# Patient Record
Sex: Male | Born: 1980
Health system: Southern US, Community
[De-identification: ages and names within clinical notes are randomized; demographics above are authoritative.]

## PROBLEM LIST (undated history)

## (undated) DIAGNOSIS — J45909 Unspecified asthma, uncomplicated: Secondary | ICD-10-CM

## (undated) HISTORY — DX: Unspecified asthma, uncomplicated: J45.909

---

## 1999-11-27 ENCOUNTER — Emergency Department (HOSPITAL_COMMUNITY): Admission: EM | Admit: 1999-11-27 | Discharge: 1999-11-27 | Payer: Self-pay | Admitting: Emergency Medicine

## 2000-05-31 ENCOUNTER — Emergency Department (HOSPITAL_COMMUNITY): Admission: EM | Admit: 2000-05-31 | Discharge: 2000-05-31 | Payer: Self-pay | Admitting: Emergency Medicine

## 2000-05-31 ENCOUNTER — Encounter: Payer: Self-pay | Admitting: Emergency Medicine

## 2000-12-15 ENCOUNTER — Emergency Department (HOSPITAL_COMMUNITY): Admission: EM | Admit: 2000-12-15 | Discharge: 2000-12-15 | Payer: Self-pay | Admitting: Emergency Medicine

## 2013-09-14 ENCOUNTER — Emergency Department: Payer: Self-pay | Admitting: Emergency Medicine

## 2014-05-21 IMAGING — CT CT HEAD WITHOUT CONTRAST
1 series · 16 of 30 positions shown, 20 images · non-contrast
Comparison: None.

CLINICAL DATA: Severe headaches

EXAM:
CT HEAD WITHOUT CONTRAST
TECHNIQUE: Contiguous axial images were obtained from the base of the skull
through the vertex without intravenous contrast.

[Series 2: head wo · axial · 0.42mm/px · z∈[+635,+761]mm · 16 of 32 slices shown, 20 images]
[im 2/32  brain]
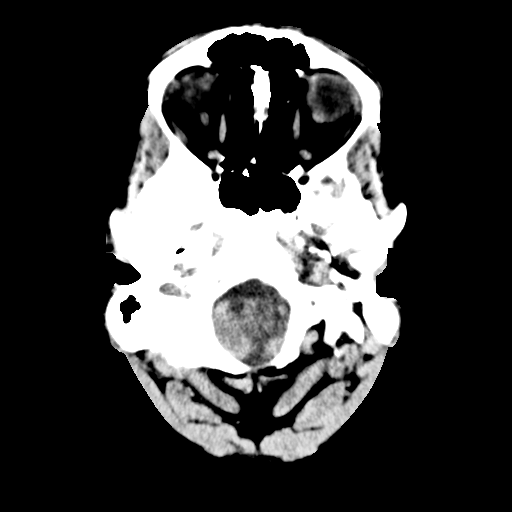
[im 2/32  bone]
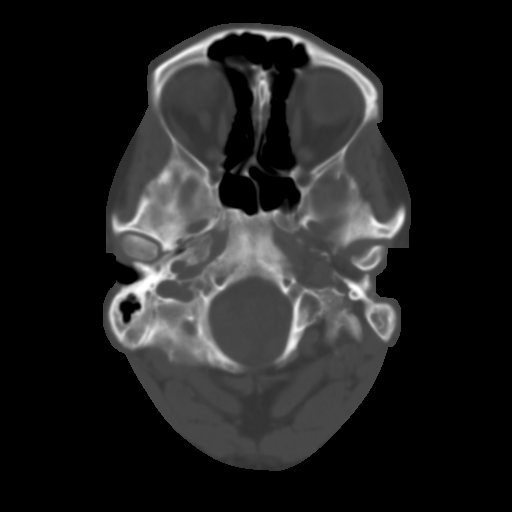
[im 4/32  brain]
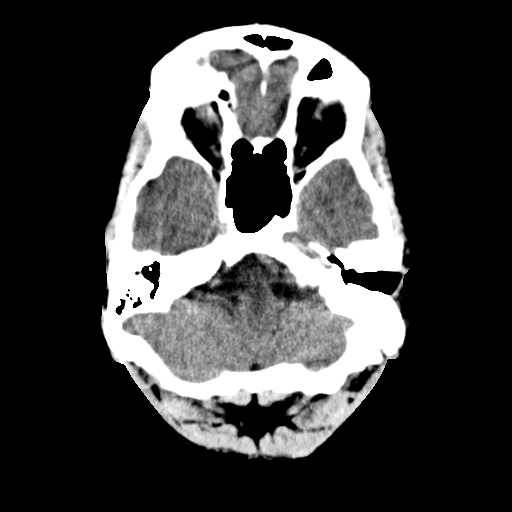
[im 6/32  brain]
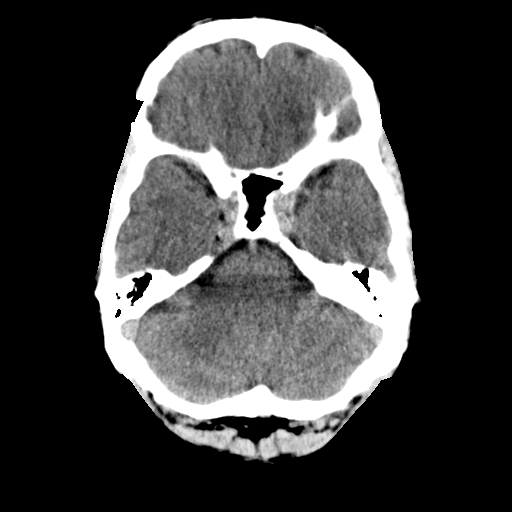
[im 8/32  brain]
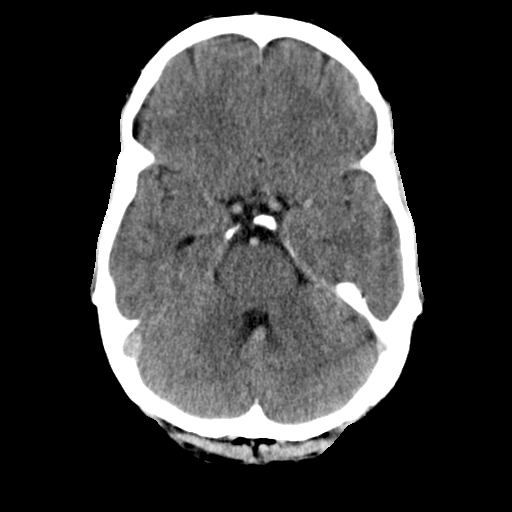
[im 9/32  brain]
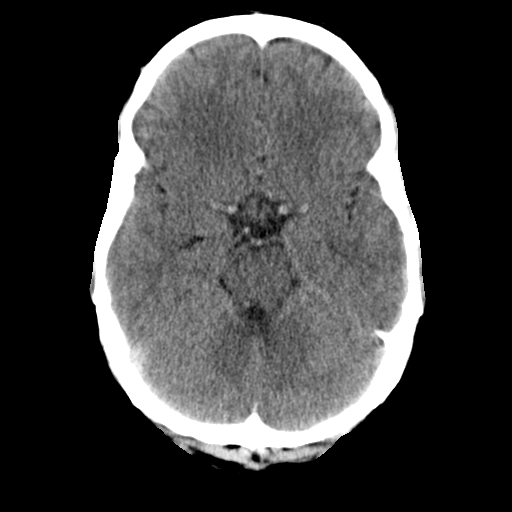
[im 9/32  bone]
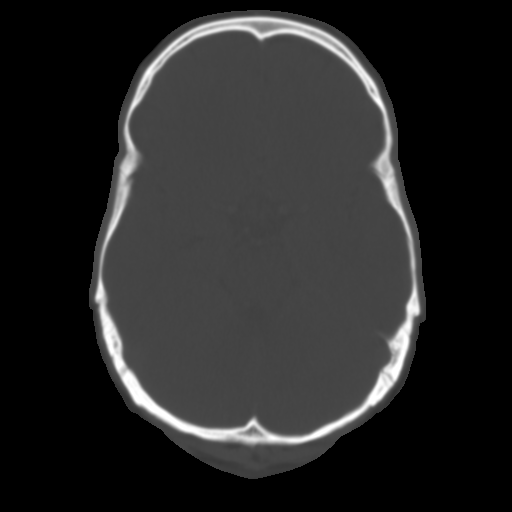
[im 11/32  brain]
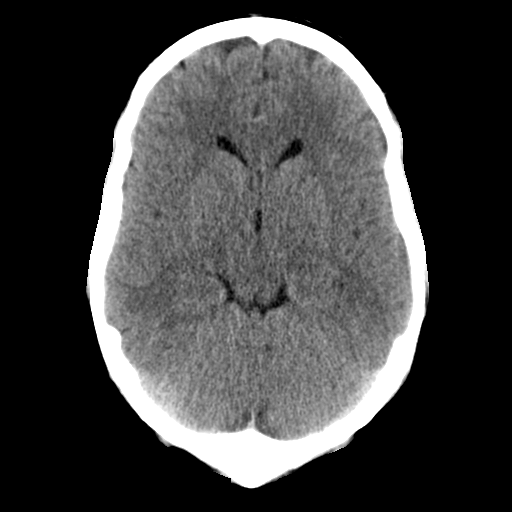
[im 13/32  brain]
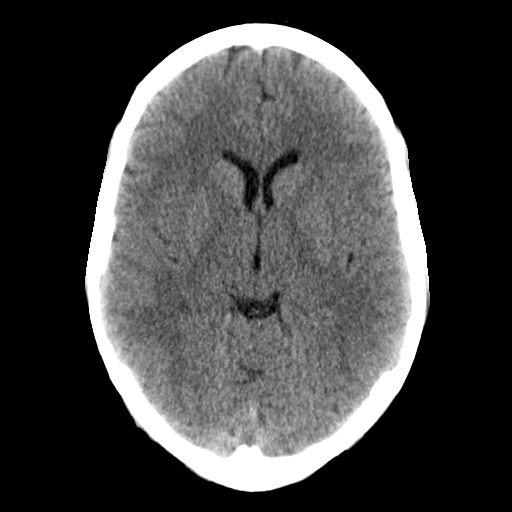
[im 15/32  brain]
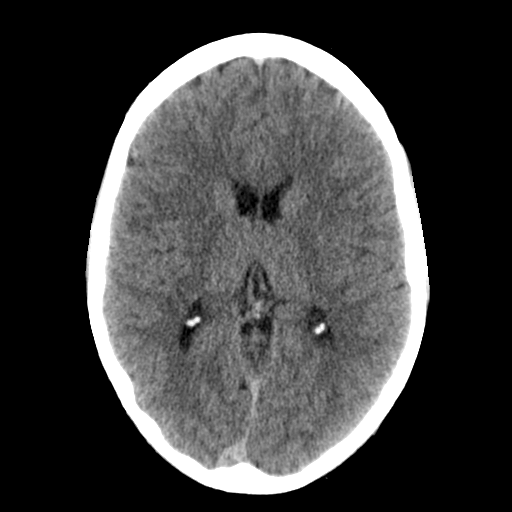
[im 17/32  brain]
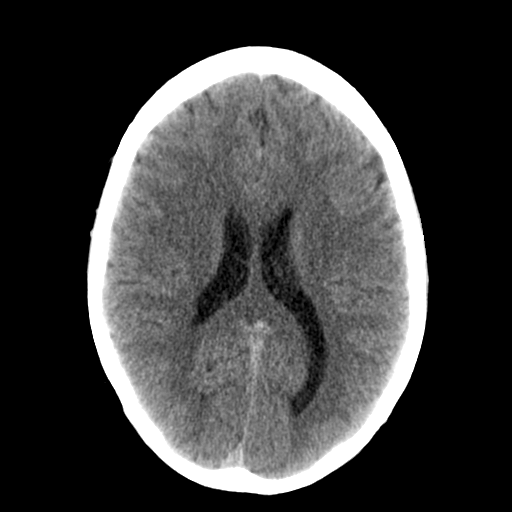
[im 17/32  bone]
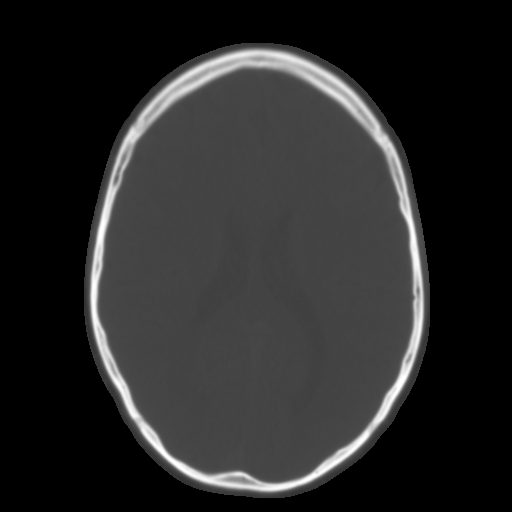
[im 19/32  brain]
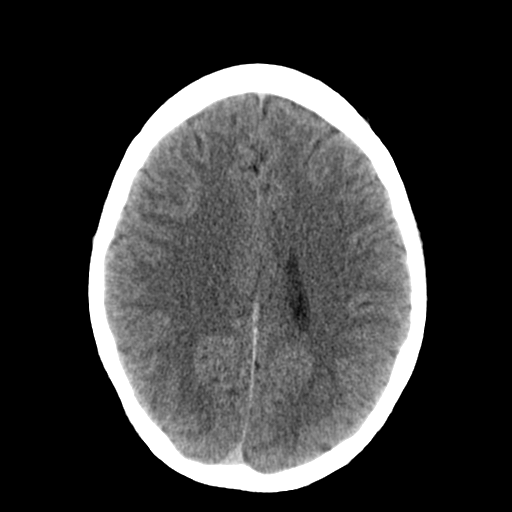
[im 21/32  brain]
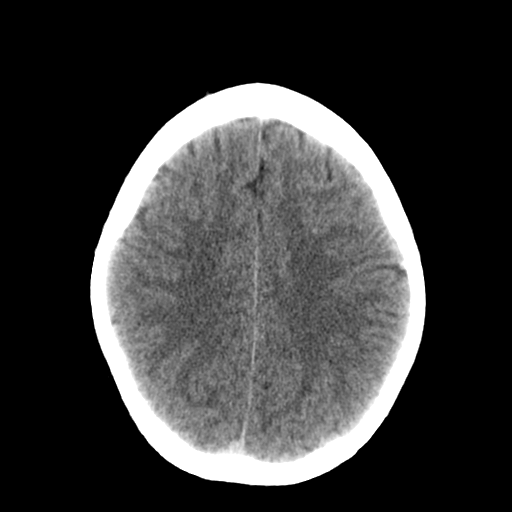
[im 23/32  brain]
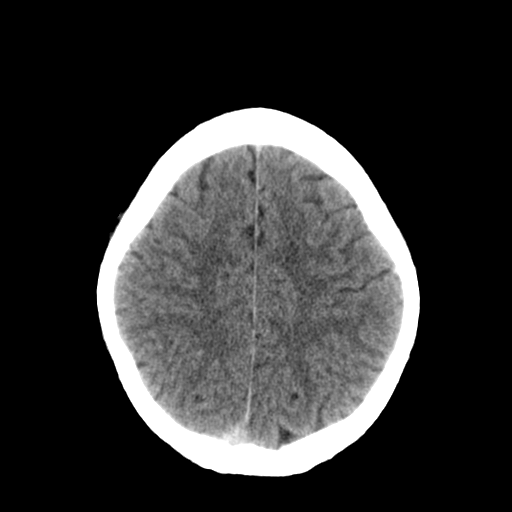
[im 24/32  brain]
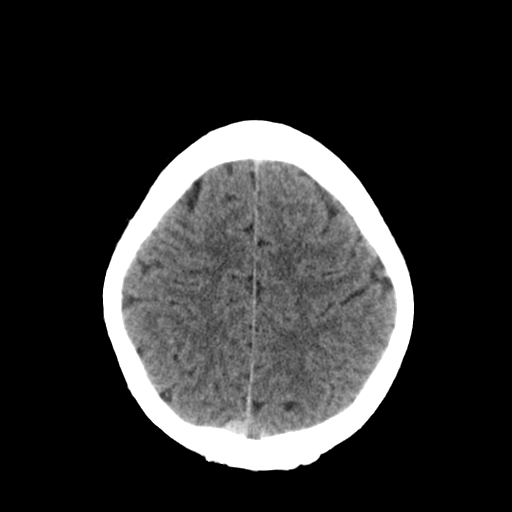
[im 24/32  bone]
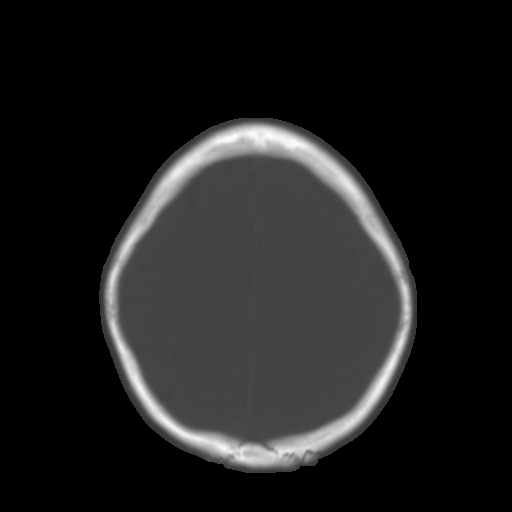
[im 26/32  brain]
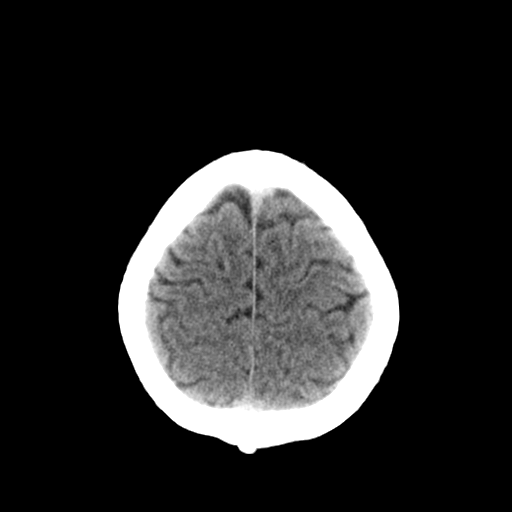
[im 28/32  brain]
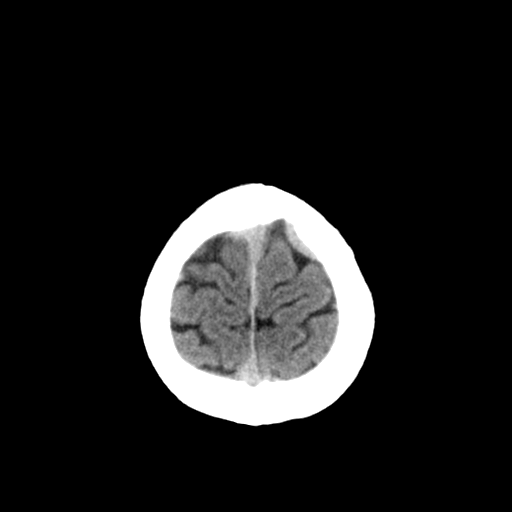
[im 30/32  brain]
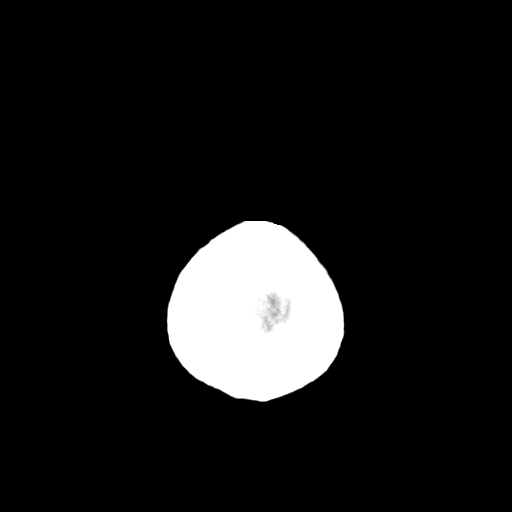

[16 of 30 positions shown; findings below may reference images not displayed]

FINDINGS: No acute intracranial hemorrhage. No focal mass lesion. No CT
evidence of acute infarction. No midline shift or mass effect. No
hydrocephalus. Basilar cisterns are patent.

Paranasal sinuses and mastoid air cells are clear.
IMPRESSION: Normal head CT

## 2015-07-04 ENCOUNTER — Encounter: Payer: Self-pay | Admitting: Family Medicine

## 2015-07-04 ENCOUNTER — Ambulatory Visit (INDEPENDENT_AMBULATORY_CARE_PROVIDER_SITE_OTHER): Payer: 59

## 2015-07-04 ENCOUNTER — Ambulatory Visit (INDEPENDENT_AMBULATORY_CARE_PROVIDER_SITE_OTHER): Payer: 59 | Admitting: Family Medicine

## 2015-07-04 VITALS — BP 122/84 | HR 68 | Temp 97.5°F | Ht 73.0 in | Wt 179.0 lb

## 2015-07-04 DIAGNOSIS — R059 Cough, unspecified: Secondary | ICD-10-CM

## 2015-07-04 DIAGNOSIS — R05 Cough: Secondary | ICD-10-CM | POA: Diagnosis not present

## 2015-07-04 MED ORDER — TRIAMCINOLONE ACETONIDE 40 MG/ML IJ SUSP
40.0000 mg | Freq: Once | INTRAMUSCULAR | Status: AC
  Administered 2015-07-04: 40 mg via INTRAMUSCULAR

## 2015-07-04 MED ORDER — AZITHROMYCIN 250 MG PO TABS: ORAL_TABLET | ORAL | Status: DC

## 2015-07-04 MED ORDER — ALBUTEROL SULFATE HFA 108 (90 BASE) MCG/ACT IN AERS: 2.0000 | INHALATION_SPRAY | Freq: Four times a day (QID) | RESPIRATORY_TRACT | Status: DC | PRN

## 2015-07-04 NOTE — Patient Instructions (Signed)
Nice to meet you!   Come back as needed.     Cough, Adult  A cough is a reflex that helps clear your throat and airways. It can help heal the body or may be a reaction to an irritated airway. A cough may only last 2 or 3 weeks (acute) or may last more than 8 weeks (chronic).  CAUSES Acute cough:  Viral or bacterial infections. Chronic cough:  Infections.  Allergies.  Asthma.  Post-nasal drip.  Smoking.  Heartburn or acid reflux.  Some medicines.  Chronic lung problems (COPD).  Cancer. SYMPTOMS   Cough.  Fever.  Chest pain.  Increased breathing rate.  High-pitched whistling sound when breathing (wheezing).  Colored mucus that you cough up (sputum). TREATMENT   A bacterial cough may be treated with antibiotic medicine.  A viral cough must run its course and will not respond to antibiotics.  Your caregiver may recommend other treatments if you have a chronic cough. HOME CARE INSTRUCTIONS   Only take over-the-counter or prescription medicines for pain, discomfort, or fever as directed by your caregiver. Use cough suppressants only as directed by your caregiver.  Use a cold steam vaporizer or humidifier in your bedroom or home to help loosen secretions.  Sleep in a semi-upright position if your cough is worse at night.  Rest as needed.  Stop smoking if you smoke. SEEK IMMEDIATE MEDICAL CARE IF:   You have pus in your sputum.  Your cough starts to worsen.  You cannot control your cough with suppressants and are losing sleep.  You begin coughing up blood.  You have difficulty breathing.  You develop pain which is getting worse or is uncontrolled with medicine.  You have a fever. MAKE SURE YOU:   Understand these instructions.  Will watch your condition.  Will get help right away if you are not doing well or get worse. Document Released: 04/12/2011 Document Revised: 01/06/2012 Document Reviewed: 04/12/2011 [facility] Patient Information  2015 [place], [place]. This information is not intended to replace advice given to you by your health care provider. Make sure you discuss any questions you have with your health care provider.

## 2015-07-04 NOTE — Progress Notes (Signed)
   HPI  Patient presents today for cough  Patient explains that the last few months he's had an intermittent dry cough. It resolved for a few days and then returned.  He states that on the 28th of last month he developed some chest tightness and dry cough. He's had decreased appetite, some mild shortness of breath, and exposure to a viral illness.  His 36-year-old child was sick with a virus and his wife got sick he was treated with a Z-Pak.  He's concerned about cancer in general, he had a friend who died recently of liver cancer. His grandfather had prostate cancer diagnosed at the age of 62.  PMH: Smoking status noted ROS: Per HPI  Objective: BP 122/84 mmHg  Pulse 68  Temp(Src) 97.5 F (36.4 C) (Oral)  Ht  (1.854 m)  Wt 179 lb (81.194 kg)  BMI 23.62 kg/m2 Gen: NAD, alert, cooperative with exam HEENT: NCAT, TMs WNL BL, nares clear, oropharynx clear CV: RRR, increased I to E ratio, no wheezing Resp: CTABL, no wheezes, non-labored Ext: No edema, warm Neuro: Alert and oriented, No gross deficits  Chest x-ray 07/04/2015: No infiltrate seen, slightly hyperinflated Awaiting radiology read   Assessment and plan:  # URI No wheezing on exam that he does have a prominently increased I to E ratio with sick contacts. Chest x-ray, treat for possible atypical pneumonia with azithromycin, also given Kenalog IM Reasons to return reviewed   Orders Placed This Encounter  Procedures  . DG Chest 2 View    Standing Status: Future     Number of Occurrences:      Standing Expiration Date: 09/02/2016    Order Specific Question:  Reason for Exam (SYMPTOM  OR DIAGNOSIS REQUIRED)    Answer:  cough, r/o CAP    Order Specific Question:  Preferred imaging location?    Answer:  Internal    Meds ordered this encounter  Medications  . albuterol (PROVENTIL HFA;VENTOLIN HFA) 108 (90 BASE) MCG/ACT inhaler    Sig: Inhale 2 puffs into the lungs every 6 (six) hours as needed for wheezing or  shortness of breath.    Dispense:  1 Inhaler    Refill:  0  . azithromycin (ZITHROMAX) 250 MG tablet    Sig: Take 2 tablets on day 1 and 1 tablet daily after that.    Dispense:  6 tablet    Refill:  0    Murtis Sink, MD [facility] 07/04/2015, 1:04 PM

## 2015-08-11 ENCOUNTER — Telehealth: Payer: Self-pay | Admitting: Family Medicine

## 2015-08-14 NOTE — Telephone Encounter (Signed)
Called [facility] and told them we got the records request. [phone].

## 2016-03-09 IMAGING — CR DG CHEST 2V
2 series · 2 of 2 positions shown · non-contrast
Comparison: None.

CLINICAL DATA: Chest tightness.  Cough .

EXAM:
CHEST  2 VIEW

[view not recorded (1 of 2)]
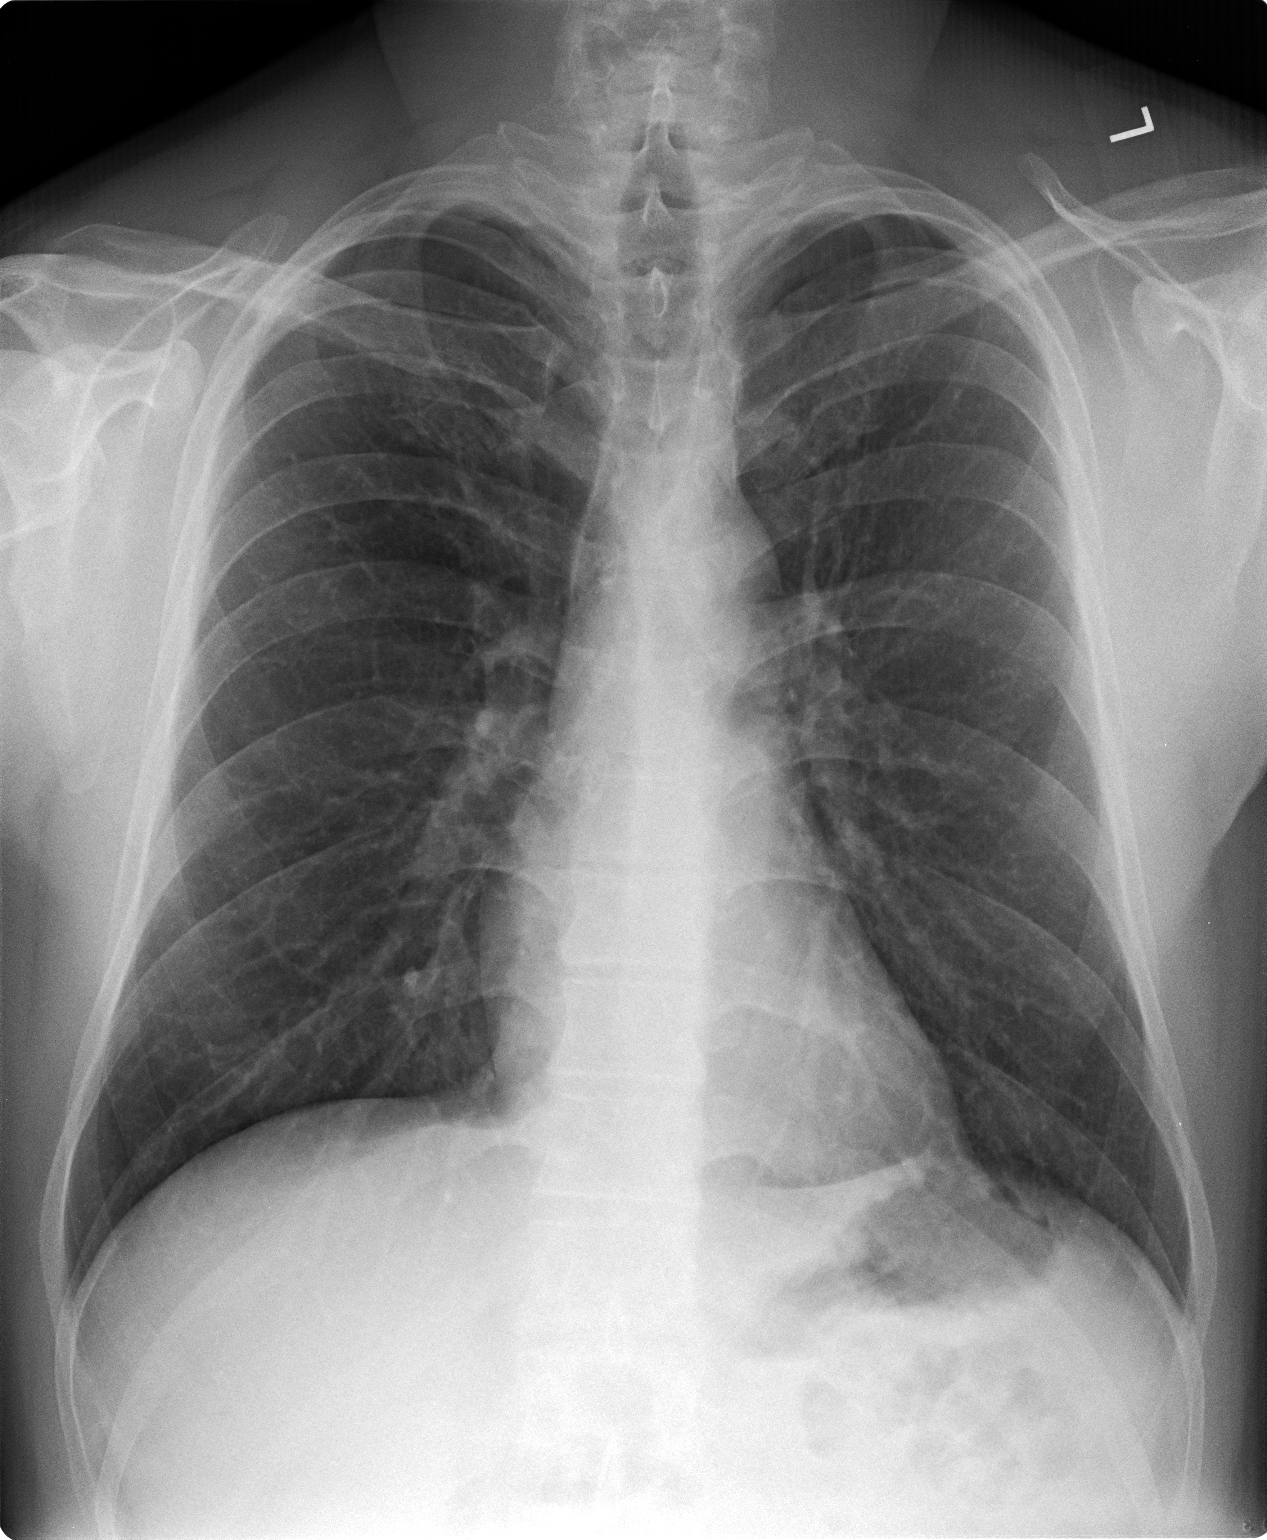

[view not recorded (2 of 2)]
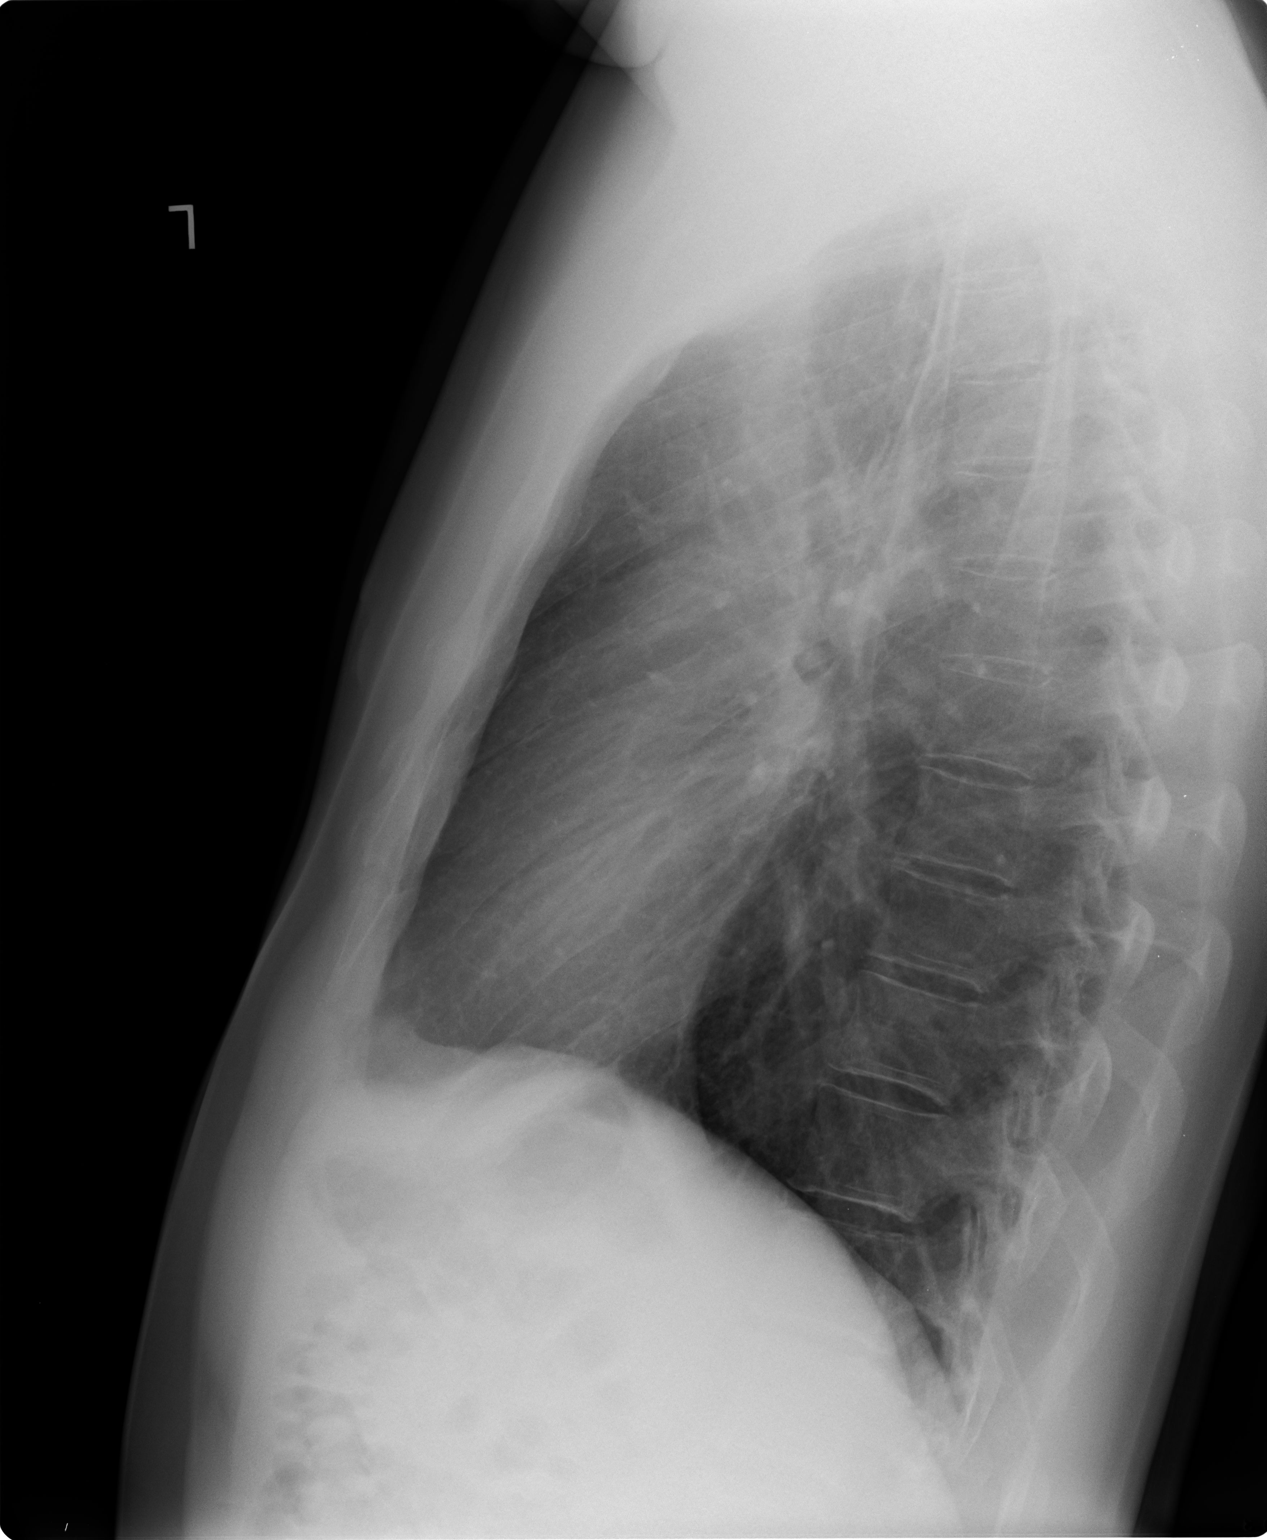

[2 of 2 positions shown; findings below may reference images not displayed]

FINDINGS: Mediastinum and hilar structures normal. Lungs are clear. Heart size
normal. No pleural effusion or pneumothorax.
IMPRESSION: No acute cardiopulmonary disease.

## 2017-05-26 ENCOUNTER — Encounter: Payer: Self-pay | Admitting: Family Medicine

## 2017-05-26 ENCOUNTER — Ambulatory Visit (INDEPENDENT_AMBULATORY_CARE_PROVIDER_SITE_OTHER): Payer: Managed Care, Other (non HMO) | Admitting: Family Medicine

## 2017-05-26 VITALS — BP 139/88 | HR 73 | Temp 97.0°F | Ht 73.0 in | Wt 179.0 lb

## 2017-05-26 DIAGNOSIS — R058 Other specified cough: Secondary | ICD-10-CM

## 2017-05-26 DIAGNOSIS — R0982 Postnasal drip: Secondary | ICD-10-CM

## 2017-05-26 DIAGNOSIS — R05 Cough: Secondary | ICD-10-CM | POA: Diagnosis not present

## 2017-05-26 MED ORDER — METHYLPREDNISOLONE ACETATE 80 MG/ML IJ SUSP
80.0000 mg | Freq: Once | INTRAMUSCULAR | Status: AC
  Administered 2017-05-26: 80 mg via INTRAMUSCULAR

## 2017-05-26 NOTE — Progress Notes (Signed)
   HPI  Patient presents today here with cough.  Patient explains that he's had cough and chest congestion with frequent throat clearing for about 6-8 weeks.  Patient states that he had a "summer cold" in June. He's had an exacerbation or 2 since that time that overall he is just a lingering cough.  Patient states that he does not have any involuntary cough but only coughed trying to clear his throat that feels like the posterior neck area and upper lung area.  He denies any fever, chills, sweats. He feels fine and has good appetite.    PMH: Smoking status noted ROS: Per HPI  Objective: BP 139/88   Pulse 73   Temp (!) 97 F (36.1 C) (Oral)   Ht 6\' 1"  (1.854 m)   Wt 179 lb (81.2 kg)   BMI 23.62 kg/m  Gen: NAD, alert, cooperative with exam HEENT: NCAT, nares with erythematous boggy mucosa and swollen turbinates, oropharynx moist with cobblestoning CV: RRR, good S1/S2, no murmur Resp: CTABL, no wheezes, non-labored Ext: No edema, warm Neuro: Alert and oriented, No gross deficits  Assessment and plan:  # Postnasal drip, postviral cough syndrome IM Depo-Medrol given which should help symptoms quite a bit Recommended nasal saline rinses or Flonase for maintenance. RTC with any concerns   Meds ordered this encounter  Medications  . methylPREDNISolone acetate (DEPO-MEDROL) injection 80 mg    Murtis SinkSam Bradshaw, MD [facility] 05/26/2017, 11:44 AM

## 2017-05-26 NOTE — Patient Instructions (Signed)
Great to see you!  Try flonase 2 sprays per nostril twice daily, if you have issues with nose bleeds or more scabbing try the nasal rinse instead ( like a neti pot)

## 2018-05-11 ENCOUNTER — Ambulatory Visit (INDEPENDENT_AMBULATORY_CARE_PROVIDER_SITE_OTHER): Payer: BLUE CROSS/BLUE SHIELD | Admitting: Family Medicine

## 2018-05-11 ENCOUNTER — Encounter: Payer: Self-pay | Admitting: Family Medicine

## 2018-05-11 VITALS — BP 129/90 | HR 70 | Temp 96.7°F | Ht 73.0 in | Wt 184.6 lb

## 2018-05-11 DIAGNOSIS — L219 Seborrheic dermatitis, unspecified: Secondary | ICD-10-CM | POA: Diagnosis not present

## 2018-05-11 MED ORDER — KETOCONAZOLE 1 % EX SHAM: 1.0000 "application " | MEDICATED_SHAMPOO | CUTANEOUS | 1 refills | Status: DC

## 2018-05-11 NOTE — Progress Notes (Signed)
   HPI  Patient presents today itchy scalp.  Patient explains he has had this for about 6 months, at times he has small red bumps on the scalp and small red areas where he has been scratching.  He denies any flaking. He states that started after using tea tree oil shampoo, he stopped this but the symptoms did not stop.  PMH: Smoking status noted ROS: Per HPI  Objective: BP 129/90   Pulse 70   Temp (!) 96.7 F (35.9 C) (Oral)   Ht 6\' 1"  (1.854 m)   Wt 184 lb 9.6 oz (83.7 kg)   BMI 24.36 kg/m  Gen: NAD, alert, cooperative with exam HEENT: NCAT CV: RRR, good S1/S2, no murmur Resp: CTABL, no wheezes, non-labored Ext: No edema, warm Neuro: Alert and oriented, No gross deficits Scalp:  Fine white flakes, erythematous papule on right parietal area No hair loss  Assessment and plan:  #Seborrheic dermatitis Likely etiology Treat with ketoconazole shampoo, discussed Selsun Blue, head and shoulders, or T gel  RTC with any concerns    Meds ordered this encounter  Medications  . KETOCONAZOLE, TOPICAL, 1 % SHAM    Sig: Apply 1 application topically every 3 (three) days.    Dispense:  200 mL    Refill:  1    Murtis SinkSam Jamaurie Bernier, MD [facility] 05/11/2018, 11:39 AM

## 2018-05-11 NOTE — Patient Instructions (Signed)
Great to see you!  Use ketoconazole shampoo every 3-4 days.   Seborrheic Dermatitis, Adult Seborrheic dermatitis is a skin disease that causes red, scaly patches. It usually occurs on the scalp, and it is often called dandruff. The patches may appear on other parts of the body. Skin patches tend to appear where there are many oil glands in the skin. Areas of the body that are commonly affected include:  Scalp.  Skin folds of the body.  Ears.  Eyebrows.  Neck.  Face.  Armpits.  The bearded area of men's faces.  The condition may come and go for no known reason, and it is often long-lasting (chronic). What are the causes? The cause of this condition is not known. What increases the risk? This condition is more likely to develop in people who:  Have certain conditions, such as: ? HIV (human immunodeficiency virus). ? AIDS (acquired immunodeficiency syndrome). ? Parkinson disease. ? Mood disorders, such as depression.  Are 2140-39 years old.  What are the signs or symptoms? Symptoms of this condition include:  Thick scales on the scalp.  Redness on the face or in the armpits.  Skin that is flaky. The flakes may be white or yellow.  Skin that seems oily or dry but is not helped with moisturizers.  Itching or burning in the affected areas.  How is this diagnosed? This condition is diagnosed with a medical history and physical exam. A sample of your skin may be tested (skin biopsy). You may need to see a skin specialist (dermatologist). How is this treated? There is no cure for this condition, but treatment can help to manage the symptoms. You may get treatment to remove scales, lower the risk of skin infection, and reduce swelling or itching. Treatment may include:  Creams that reduce swelling and irritation (steroids).  Creams that reduce skin yeast.  Medicated shampoo, soaps, moisturizing creams, or ointments.  Medicated moisturizing creams or  ointments.  Follow these instructions at home:  Apply over-the-counter and prescription medicines only as told by your health care provider.  Use any medicated shampoo, soaps, skin creams, or ointments only as told by your health care provider.  Keep all follow-up visits as told by your health care provider. This is important. Contact a health care provider if:  Your symptoms do not improve with treatment.  Your symptoms get worse.  You have new symptoms. This information is not intended to replace advice given to you by your health care provider. Make sure you discuss any questions you have with your health care provider. Document Released: 10/14/2005 Document Revised: 05/03/2016 Document Reviewed: 02/01/2016 Elsevier Interactive Patient Education  [employer]2018 Elsevier Inc.

## 2018-10-12 DIAGNOSIS — H6982 Other specified disorders of Eustachian tube, left ear: Secondary | ICD-10-CM | POA: Diagnosis not present

## 2019-01-22 ENCOUNTER — Other Ambulatory Visit: Payer: Self-pay

## 2019-01-22 ENCOUNTER — Ambulatory Visit (INDEPENDENT_AMBULATORY_CARE_PROVIDER_SITE_OTHER): Payer: BLUE CROSS/BLUE SHIELD | Admitting: Family Medicine

## 2019-01-22 ENCOUNTER — Encounter: Payer: Self-pay | Admitting: Family Medicine

## 2019-01-22 DIAGNOSIS — J301 Allergic rhinitis due to pollen: Secondary | ICD-10-CM

## 2019-01-22 DIAGNOSIS — H6506 Acute serous otitis media, recurrent, bilateral: Secondary | ICD-10-CM | POA: Diagnosis not present

## 2019-01-22 MED ORDER — FEXOFENADINE-PSEUDOEPHED ER 180-240 MG PO TB24: 1.0000 | ORAL_TABLET | Freq: Every evening | ORAL | 11 refills | Status: DC

## 2019-01-22 MED ORDER — PREDNISONE 10 MG PO TABS: ORAL_TABLET | ORAL | 0 refills | Status: DC

## 2019-01-22 MED ORDER — AMOXICILLIN-POT CLAVULANATE 875-125 MG PO TABS: 1.0000 | ORAL_TABLET | Freq: Two times a day (BID) | ORAL | 0 refills | Status: DC

## 2019-01-22 NOTE — Progress Notes (Signed)
No chief complaint on file.   HPI Onset in mid-January. Starting 8 days ago having popping in ears. Has permanent holes in TMS due to multiple sets of tubes in childhood. Concerned for allergy &/or infection. Clearing throat, sniffing. Blowing nose every 10 min. Clear. Worse when upright. Breathing okay. There is moderate sore throat. Patient reports not coughing frequently. There is no fever, chills or sweats. The patient denies being short of breath.  PMH: Smoking status noted ROS: Per HPI  Assessment and plan:  1. Seasonal allergic rhinitis due to pollen   2. Recurrent acute serous otitis media of both ears     Meds ordered this encounter  Medications  . amoxicillin-clavulanate (AUGMENTIN) 875-125 MG tablet    Sig: Take 1 tablet by mouth 2 (two) times daily. Take all of this medication    Dispense:  20 tablet    Refill:  0  . predniSONE (DELTASONE) 10 MG tablet    Sig: Take 5 daily for 2 days followed by 4,3,2 and 1 for 2 days each.    Dispense:  30 tablet    Refill:  0  . fexofenadine-pseudoephedrine (ALLEGRA-D 24) 180-240 MG 24 hr tablet    Sig: Take 1 tablet by mouth every evening. For allergy and congestion    Dispense:  30 tablet    Refill:  11    No orders of the defined types were placed in this encounter.  Virtual Visit via telephone Note  I discussed the limitations, risks, security and privacy concerns of performing an evaluation and management service by telephone and the availability of in person appointments. I also discussed with the patient that there may be a patient responsible charge related to this service. The patient expressed understanding and agreed to proceed.  Follow Up Instructions:   I discussed the assessment and treatment plan with the patient. The patient was provided an opportunity to ask questions and all were answered. The patient agreed with the plan and demonstrated an understanding of the instructions.   The patient was advised to call  back or seek an in-person evaluation if the symptoms worsen or if the condition fails to improve as anticipated.  Call started: 2:34 Call ended:  2:42 Call minutes: 8  Follow up as needed.  Mechele Claude, MD

## 2019-02-24 ENCOUNTER — Other Ambulatory Visit: Payer: Self-pay

## 2019-02-24 DIAGNOSIS — H9209 Otalgia, unspecified ear: Secondary | ICD-10-CM

## 2019-02-24 NOTE — Progress Notes (Signed)
ebnt

## 2019-03-04 DIAGNOSIS — J301 Allergic rhinitis due to pollen: Secondary | ICD-10-CM | POA: Diagnosis not present

## 2019-03-04 DIAGNOSIS — H6982 Other specified disorders of Eustachian tube, left ear: Secondary | ICD-10-CM | POA: Diagnosis not present

## 2019-08-10 DIAGNOSIS — H6982 Other specified disorders of Eustachian tube, left ear: Secondary | ICD-10-CM | POA: Diagnosis not present

## 2019-09-01 ENCOUNTER — Ambulatory Visit: Admit: 2019-09-01 | Payer: Self-pay | Admitting: Otolaryngology

## 2019-09-01 SURGERY — NASOPHARYNGOSCOPY, WITH EUSTACHIAN TUBE DILATION USING BALLOON
Anesthesia: General | Laterality: Left

## 2019-09-02 DIAGNOSIS — H6983 Other specified disorders of Eustachian tube, bilateral: Secondary | ICD-10-CM | POA: Diagnosis not present

## 2020-04-05 DIAGNOSIS — H6983 Other specified disorders of Eustachian tube, bilateral: Secondary | ICD-10-CM | POA: Diagnosis not present

## 2020-04-05 DIAGNOSIS — T162XXA Foreign body in left ear, initial encounter: Secondary | ICD-10-CM | POA: Diagnosis not present

## 2020-04-05 DIAGNOSIS — H6501 Acute serous otitis media, right ear: Secondary | ICD-10-CM | POA: Diagnosis not present

## 2020-04-05 DIAGNOSIS — H6980 Other specified disorders of Eustachian tube, unspecified ear: Secondary | ICD-10-CM | POA: Diagnosis not present

## 2020-04-28 DIAGNOSIS — H6981 Other specified disorders of Eustachian tube, right ear: Secondary | ICD-10-CM | POA: Diagnosis not present

## 2020-05-15 DIAGNOSIS — Z3009 Encounter for other general counseling and advice on contraception: Secondary | ICD-10-CM | POA: Diagnosis not present

## 2020-08-19 DIAGNOSIS — Z302 Encounter for sterilization: Secondary | ICD-10-CM | POA: Diagnosis not present

## 2023-02-10 DIAGNOSIS — J301 Allergic rhinitis due to pollen: Secondary | ICD-10-CM | POA: Diagnosis not present

## 2023-02-10 DIAGNOSIS — H90A32 Mixed conductive and sensorineural hearing loss, unilateral, left ear with restricted hearing on the contralateral side: Secondary | ICD-10-CM | POA: Diagnosis not present

## 2023-02-10 DIAGNOSIS — H6983 Other specified disorders of Eustachian tube, bilateral: Secondary | ICD-10-CM | POA: Diagnosis not present

## 2023-07-15 DIAGNOSIS — M542 Cervicalgia: Secondary | ICD-10-CM | POA: Diagnosis not present

## 2023-07-24 ENCOUNTER — Telehealth: Payer: Self-pay

## 2023-07-24 NOTE — Telephone Encounter (Signed)
Medicaid Managed Care   Unsuccessful Outreach Note  07/24/2023 Name: JADORE PAOLUCCI MRN: [id number] DOB: 1981-02-05  Referred by: Mechele Claude, MD Reason for referral : No chief complaint on file.   An unsuccessful telephone outreach was attempted today. The patient was referred to the case management team for assistance with care management and care coordination.   Follow Up Plan: If patient returns call to provider office, please advise to call Embedded Care Management Care Guide Nicholes Rough* at [phone]*  Nicholes Rough, CMA Care Guide [facility] [employer]

## 2023-10-08 ENCOUNTER — Ambulatory Visit: Payer: Medicaid Other | Admitting: Family Medicine

## 2023-10-08 ENCOUNTER — Encounter: Payer: Self-pay | Admitting: Family Medicine

## 2023-10-08 VITALS — BP 118/76 | HR 68 | Ht 73.0 in | Wt 192.4 lb

## 2023-10-08 DIAGNOSIS — Z13220 Encounter for screening for lipoid disorders: Secondary | ICD-10-CM | POA: Diagnosis not present

## 2023-10-08 DIAGNOSIS — J4520 Mild intermittent asthma, uncomplicated: Secondary | ICD-10-CM | POA: Diagnosis not present

## 2023-10-08 DIAGNOSIS — Z7689 Persons encountering health services in other specified circumstances: Secondary | ICD-10-CM

## 2023-10-08 DIAGNOSIS — Z87891 Personal history of nicotine dependence: Secondary | ICD-10-CM | POA: Insufficient documentation

## 2023-10-08 DIAGNOSIS — Z131 Encounter for screening for diabetes mellitus: Secondary | ICD-10-CM

## 2023-10-08 DIAGNOSIS — R7301 Impaired fasting glucose: Secondary | ICD-10-CM | POA: Diagnosis not present

## 2023-10-08 MED ORDER — ALBUTEROL SULFATE HFA 108 (90 BASE) MCG/ACT IN AERS: 2.0000 | INHALATION_SPRAY | Freq: Four times a day (QID) | RESPIRATORY_TRACT | 0 refills | Status: DC | PRN

## 2023-10-08 NOTE — Progress Notes (Signed)
New Patient Office Visit  Subjective    Patient ID: Brad Wang, male    DOB: 09-30-1981  Age: 42 y.o. MRN: [id number]  CC:  Chief Complaint  Patient presents with   New Patient (Initial Visit)    HPI Brad Wang presents to establish care  Pt was previously seeing Dr. Christell Constant at [facility] although this was infrequent.  Was not being treated for anything actively other than some mild asthma that bothers him once or twice a year.  Has a history of exercise-induced and asthma as a teenager that he has mostly outgrown.  Patient is a former smoker, quit 4 years ago and smoked quarter to a half a pack per day for about 20 years prior to that.  No concerns today.  PMH: exercise induced asthma.    PSH: no surgery.  Ear tubes.  Chronic infections as a child.    FH: pgf - colon cancer 60s.   Father - may have had renal cell carcinoma.  Recently passed away, died of dementia..    Tobacco use: former - quit 3 years ago.  20 years 1/2 ppd.   Alcohol use: 1-2/week.   Drug use: none Marital status: married.  3 children.  11, 5, 3.   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      Outpatient Encounter Medications as of 10/08/2023  Medication Sig   albuterol (VENTOLIN HFA) 108 (90 Base) MCG/ACT inhaler Inhale 2 puffs into the lungs every 6 (six) hours as needed for wheezing or shortness of breath.   amoxicillin-clavulanate (AUGMENTIN) 875-125 MG tablet Take 1 tablet by mouth 2 (two) times daily. Take all of this medication   fexofenadine-pseudoephedrine (ALLEGRA-D 24) 180-240 MG 24 hr tablet Take 1 tablet by mouth every evening. For allergy and congestion   KETOCONAZOLE, TOPICAL, 1 % SHAM Apply 1 application topically every 3 (three) days.   predniSONE (DELTASONE) 10 MG tablet Take 5 daily for 2 days followed by 4,3,2 and 1 for 2 days each.   [DISCONTINUED] albuterol (PROVENTIL HFA;VENTOLIN HFA) 108 (90 BASE) MCG/ACT inhaler Inhale 2 puffs into the lungs every 6  (six) hours as needed for wheezing or shortness of breath.   No facility-administered encounter medications on file as of 10/08/2023.    Past Medical History:  Diagnosis Date   Asthma     No past surgical history on file.  Family History  Problem Relation Age of Onset   Diabetes Mother    Cancer Father        prostate   Kidney disease Father     Social History   Socioeconomic History   Marital status: Married    Spouse name: Not on file   Number of children: Not on file   Years of education: Not on file   Highest education level: Not on file  Occupational History   Not on file  Tobacco Use   Smoking status: Former   Smokeless tobacco: Not on file  Substance and Sexual Activity   Alcohol use: Not on file   Drug use: Not on file   Sexual activity: Not on file  Other Topics Concern   Not on file  Social History Narrative   Not on file   Social Determinants of Health   Financial Resource Strain: Not on file  Food Insecurity: Not on file  Transportation Needs: Not on file  Physical Activity: Not on file  Stress: Not on file  Social Connections: Not on file  Intimate  Partner Violence: Not on file    ROS      Objective    BP 118/76   Pulse 68   Ht 6\' 1"  (1.854 m)   Wt 192 lb 6.4 oz (87.3 kg)   SpO2 96%   BMI 25.38 kg/m   Physical Exam General: Alert, oriented HEENT: PERRLA, EOMI, moist mucosa CV: Regular rate and rhythm Pulmonary: Lungs clear bilaterally GI: Soft, nontender MSK: Strength equal bilaterally, normal gait Extremities: No pedal edema Psych: Pleasant affect     Assessment & Plan:   Encounter to establish care  Screening cholesterol level -     Lipid panel  Screening for diabetes mellitus -     Hemoglobin A1c  Mild intermittent asthma without complication Assessment & Plan: Mostly bothers him with changes in weather or when traveling to and from [place].  Uses inhaler once or twice a year.  Refill sent in.  Orders: -      Albuterol Sulfate HFA; Inhale 2 puffs into the lungs every 6 (six) hours as needed for wheezing or shortness of breath.  Dispense: 1 each; Refill: 0 -     CBC  Former smoker Assessment & Plan: Quit 3 years ago.  5-10-pack-year history prior to that  Orders: -     Comprehensive metabolic panel    Return in about 1 year (around 10/07/2024) for physical.   Sandre Kitty, MD

## 2023-10-08 NOTE — Patient Instructions (Signed)
It was nice to see you today,  We addressed the following topics today: -I have ordered some blood test.  I will let you know the results of these when I get them.  Depending on the results we will either follow-up in 1 year or sooner if needed - I have sent in a refill of your albuterol inhaler  Have a great day,  Frederic Jericho, MD

## 2023-10-08 NOTE — Assessment & Plan Note (Signed)
Quit 3 years ago.  5-10-pack-year history prior to that

## 2023-10-08 NOTE — Assessment & Plan Note (Signed)
Mostly bothers him with changes in weather or when traveling to and from [place].  Uses inhaler once or twice a year.  Refill sent in.

## 2023-10-09 LAB — LIPID PANEL
Chol/HDL Ratio: 8 {ratio} — ABNORMAL HIGH (ref 0.0–5.0)
Cholesterol, Total: 289 mg/dL — ABNORMAL HIGH (ref 100–199)
HDL: 36 mg/dL — ABNORMAL LOW (ref >39)
LDL Chol Calc (NIH): 158 mg/dL — ABNORMAL HIGH (ref 0–99)
Triglycerides: 493 mg/dL — ABNORMAL HIGH (ref 0–149)
VLDL Cholesterol Cal: 95 mg/dL — ABNORMAL HIGH (ref 5–40)

## 2023-10-09 LAB — COMPREHENSIVE METABOLIC PANEL
ALT: 40 [IU]/L (ref 0–44)
AST: 23 [IU]/L (ref 0–40)
Albumin: 4.3 g/dL (ref 4.1–5.1)
Alkaline Phosphatase: 63 [IU]/L (ref 44–121)
BUN/Creatinine Ratio: 9 (ref 9–20)
BUN: 10 mg/dL (ref 6–24)
Bilirubin Total: 0.9 mg/dL (ref 0.0–1.2)
CO2: 24 mmol/L (ref 20–29)
Calcium: 9.3 mg/dL (ref 8.7–10.2)
Chloride: 103 mmol/L (ref 96–106)
Creatinine, Ser: 1.08 mg/dL (ref 0.76–1.27)
Globulin, Total: 2.6 g/dL (ref 1.5–4.5)
Glucose: 96 mg/dL (ref 70–99)
Potassium: 4 mmol/L (ref 3.5–5.2)
Sodium: 140 mmol/L (ref 134–144)
Total Protein: 6.9 g/dL (ref 6.0–8.5)
eGFR: 87 mL/min/{1.73_m2} (ref >59)

## 2023-10-09 LAB — CBC
Hematocrit: 47 % (ref 37.5–51.0)
Hemoglobin: 15.8 g/dL (ref 13.0–17.7)
MCH: 30.4 pg (ref 26.6–33.0)
MCHC: 33.6 g/dL (ref 31.5–35.7)
MCV: 91 fL (ref 79–97)
Platelets: 271 10*3/uL (ref 150–450)
RBC: 5.19 x10E6/uL (ref 4.14–5.80)
RDW: 12.8 % (ref 11.6–15.4)
WBC: 4.2 10*3/uL (ref 3.4–10.8)

## 2023-10-09 LAB — HEMOGLOBIN A1C
Est. average glucose Bld gHb Est-mCnc: 97 mg/dL
Hgb A1c MFr Bld: 5 % (ref 4.8–5.6)

## 2023-10-15 ENCOUNTER — Telehealth: Payer: Self-pay | Admitting: *Deleted

## 2023-10-15 NOTE — Telephone Encounter (Signed)
LVM for pt to call office to assist in getting his lab appointment scheduled in 2 months per provider.        Copied from CRM [id number]. Topic: Appointments - Appointment Scheduling >> Oct 14, 2023 10:19 AM Tiffany H wrote: Patient/patient representative is calling to schedule an appointment. Refer to attachments for appointment information. Patient was advised by provider to schedule a lab visit 2 months from 10/08/23. Please assist.

## 2023-11-11 NOTE — Telephone Encounter (Signed)
 Pt scheduled

## 2023-11-27 ENCOUNTER — Other Ambulatory Visit: Payer: Self-pay

## 2023-11-27 DIAGNOSIS — Z13220 Encounter for screening for lipoid disorders: Secondary | ICD-10-CM

## 2023-12-11 ENCOUNTER — Other Ambulatory Visit: Payer: Medicaid Other

## 2023-12-11 DIAGNOSIS — Z13220 Encounter for screening for lipoid disorders: Secondary | ICD-10-CM

## 2023-12-12 LAB — LIPID PANEL
Chol/HDL Ratio: 5.7 {ratio} — ABNORMAL HIGH (ref 0.0–5.0)
Cholesterol, Total: 264 mg/dL — ABNORMAL HIGH (ref 100–199)
HDL: 46 mg/dL (ref >39)
LDL Chol Calc (NIH): 183 mg/dL — ABNORMAL HIGH (ref 0–99)
Triglycerides: 189 mg/dL — ABNORMAL HIGH (ref 0–149)
VLDL Cholesterol Cal: 35 mg/dL (ref 5–40)

## 2023-12-15 ENCOUNTER — Telehealth: Payer: Self-pay

## 2023-12-15 DIAGNOSIS — E782 Mixed hyperlipidemia: Secondary | ICD-10-CM

## 2023-12-15 NOTE — Telephone Encounter (Signed)
Copied from CRM [id number]. Topic: Clinical - Lab/Test Results >> Dec 15, 2023 10:41 AM Brad Wang T wrote: Reason for CRM: patient would like a call back to go over his lab results more.

## 2023-12-15 NOTE — Telephone Encounter (Signed)
I called the pt and discussed his lab results with him.  Please call the patient to schedule a fasting lab visit in approximately 3 months.

## 2023-12-15 NOTE — Addendum Note (Signed)
Addended by: Sandre Kitty on: 12/15/2023 06:48 PM   Modules accepted: Orders

## 2023-12-17 NOTE — Telephone Encounter (Signed)
Lab appt scheduled per provider.

## 2024-03-15 ENCOUNTER — Other Ambulatory Visit: Payer: Medicaid Other

## 2024-03-15 DIAGNOSIS — E782 Mixed hyperlipidemia: Secondary | ICD-10-CM | POA: Diagnosis not present

## 2024-03-17 ENCOUNTER — Ambulatory Visit: Payer: Self-pay | Admitting: Family Medicine

## 2024-03-17 ENCOUNTER — Telehealth: Payer: Self-pay

## 2024-03-17 DIAGNOSIS — E782 Mixed hyperlipidemia: Secondary | ICD-10-CM

## 2024-03-17 LAB — LIPID PANEL
Chol/HDL Ratio: 7.4 ratio — ABNORMAL HIGH (ref 0.0–5.0)
Cholesterol, Total: 304 mg/dL — ABNORMAL HIGH (ref 100–199)
HDL: 41 mg/dL (ref >39)
LDL Chol Calc (NIH): 159 mg/dL — ABNORMAL HIGH (ref 0–99)
Triglycerides: 535 mg/dL — ABNORMAL HIGH (ref 0–149)
VLDL Cholesterol Cal: 104 mg/dL — ABNORMAL HIGH (ref 5–40)

## 2024-03-17 LAB — APOLIPOPROTEIN B: Apolipoprotein B: 158 mg/dL — ABNORMAL HIGH (ref <90)

## 2024-03-17 LAB — LIPOPROTEIN A (LPA): Lipoprotein (a): 32.1 nmol/L (ref <75.0)

## 2024-03-17 MED ORDER — ROSUVASTATIN CALCIUM 5 MG PO TABS: 5.0000 mg | ORAL_TABLET | Freq: Every day | ORAL | 3 refills | Status: DC

## 2024-03-17 NOTE — Telephone Encounter (Signed)
 Please let the patient know I have sent it in and that he will need to schedule a fasting lab visit for about 6-8 weeks from now.

## 2024-03-17 NOTE — Progress Notes (Signed)
 Pt is advised and agreeable

## 2024-03-17 NOTE — Telephone Encounter (Signed)
 Lvm and asked patient to call back.

## 2024-03-17 NOTE — Addendum Note (Signed)
 Addended by: Laneta Pintos on: 03/17/2024 05:44 PM   Modules accepted: Orders

## 2024-03-17 NOTE — Telephone Encounter (Signed)
 Copied from CRM [id number]. Topic: Clinical - Lab/Test Results >> Mar 17, 2024  3:01 PM Stanly Early wrote: Reason for CRM: patient missed a call regarding his results [phone]    Called pt he is advised about his lab results and recommendation he is agreeable to starting a medication his pharmacy is [facility]

## 2024-03-18 NOTE — Telephone Encounter (Signed)
Pt informed and appt scheduled.

## 2024-05-06 ENCOUNTER — Other Ambulatory Visit

## 2024-05-06 DIAGNOSIS — E782 Mixed hyperlipidemia: Secondary | ICD-10-CM

## 2024-05-07 LAB — COMPREHENSIVE METABOLIC PANEL WITH GFR
ALT: 29 IU/L (ref 0–44)
AST: 19 IU/L (ref 0–40)
Albumin: 4.4 g/dL (ref 4.1–5.1)
Alkaline Phosphatase: 64 IU/L (ref 44–121)
BUN/Creatinine Ratio: 13 (ref 9–20)
BUN: 15 mg/dL (ref 6–24)
Bilirubin Total: 1.4 mg/dL — ABNORMAL HIGH (ref 0.0–1.2)
CO2: 21 mmol/L (ref 20–29)
Calcium: 9 mg/dL (ref 8.7–10.2)
Chloride: 102 mmol/L (ref 96–106)
Creatinine, Ser: 1.15 mg/dL (ref 0.76–1.27)
Globulin, Total: 2.5 g/dL (ref 1.5–4.5)
Glucose: 88 mg/dL (ref 70–99)
Potassium: 4.4 mmol/L (ref 3.5–5.2)
Sodium: 138 mmol/L (ref 134–144)
Total Protein: 6.9 g/dL (ref 6.0–8.5)
eGFR: 80 mL/min/1.73 (ref >59)

## 2024-05-07 LAB — LIPID PANEL
Chol/HDL Ratio: 5 ratio (ref 0.0–5.0)
Cholesterol, Total: 239 mg/dL — ABNORMAL HIGH (ref 100–199)
HDL: 48 mg/dL (ref >39)
LDL Chol Calc (NIH): 157 mg/dL — ABNORMAL HIGH (ref 0–99)
Triglycerides: 189 mg/dL — ABNORMAL HIGH (ref 0–149)
VLDL Cholesterol Cal: 34 mg/dL (ref 5–40)

## 2024-05-07 LAB — APOLIPOPROTEIN B: Apolipoprotein B: 134 mg/dL — ABNORMAL HIGH (ref <90)

## 2024-05-10 ENCOUNTER — Ambulatory Visit: Payer: Self-pay

## 2024-05-10 NOTE — Telephone Encounter (Signed)
 FYI - no earlier appts available and pt declined UC.   FYI Only or Action Required?: FYI only for provider.  Patient was last seen in primary care on 10/08/2023 by Chandra Toribio POUR, MD.  Called Nurse Triage reporting No chief complaint on file..  Symptoms began several weeks ago.  Interventions attempted: Nothing.  Symptoms are: unchanged.  Triage Disposition: See PCP When Office is Open (Within 3 Days)  Patient/caregiver understands and will follow disposition?: Yes  Copied from CRM [id number]. Topic: Clinical - Red Word Triage >> May 10, 2024  4:28 PM Delon HERO wrote: Red Word that prompted transfer to Nurse Triage: Patient is calling to report chest tightness with anxiety for a couple of weeks. With Shortness of breathe, adrenaline rush  has been on rosuvastatin  (CRESTOR ) 5 MG tablet [[id number]] for a month. Labs have improved. Reason for Disposition  [1] Chest pain(s) lasting a few seconds AND [2] persists > 3 days  Answer Assessment - Initial Assessment Questions Patient was scheduled for soonest available in office visit, declined earlier UC visit.   1. LOCATION: Where does it hurt?       Left upper rib 2. RADIATION: Does the pain go anywhere else? (e.g., into neck, jaw, arms, back)     No 3. ONSET: When did the chest pain begin? (Minutes, hours or days)      2-3 weeks ago 4. PATTERN: Does the pain come and go, or has it been constant since it started?  Does it get worse with exertion?      Intermittent, worse with a deep breath 5. DURATION: How long does it last (e.g., seconds, minutes, hours)     Seconds 6. SEVERITY: How bad is the pain?  (e.g., Scale 1-10; mild, moderate, or severe)     Changes 7. CARDIAC RISK FACTORS: Do you have any history of heart problems or risk factors for heart disease? (e.g., angina, prior heart attack; diabetes, high blood pressure, high cholesterol, smoker, or strong family history of heart disease)     High cholesterol 8.  PULMONARY RISK FACTORS: Do you have any history of lung disease?  (e.g., blood clots in lung, asthma, emphysema, birth control pills)     Has albuterol  for hx of childhood asthma, takes occassionally for sports 9. CAUSE: What do you think is causing the chest pain?     Unsure 10. OTHER SYMPTOMS: Do you have any other symptoms? (e.g., dizziness, nausea, vomiting, sweating, fever, difficulty breathing, cough)       Intermittent shortness of breath that is better with activity/when distracted  Protocols used: Chest Pain-A-AH

## 2024-05-12 ENCOUNTER — Other Ambulatory Visit: Payer: Self-pay | Admitting: Family Medicine

## 2024-05-12 DIAGNOSIS — J4520 Mild intermittent asthma, uncomplicated: Secondary | ICD-10-CM

## 2024-05-13 ENCOUNTER — Ambulatory Visit: Payer: Self-pay | Admitting: Family Medicine

## 2024-05-13 MED ORDER — ROSUVASTATIN CALCIUM 10 MG PO TABS: 10.0000 mg | ORAL_TABLET | Freq: Every day | ORAL | 3 refills | Status: DC

## 2024-05-19 ENCOUNTER — Ambulatory Visit: Admitting: Family Medicine

## 2024-05-19 ENCOUNTER — Encounter: Payer: Self-pay | Admitting: Family Medicine

## 2024-05-19 VITALS — BP 118/82 | HR 65 | Ht 73.0 in | Wt 181.4 lb

## 2024-05-19 DIAGNOSIS — E782 Mixed hyperlipidemia: Secondary | ICD-10-CM | POA: Diagnosis not present

## 2024-05-19 DIAGNOSIS — R0781 Pleurodynia: Secondary | ICD-10-CM

## 2024-05-19 DIAGNOSIS — E785 Hyperlipidemia, unspecified: Secondary | ICD-10-CM | POA: Insufficient documentation

## 2024-05-19 DIAGNOSIS — F064 Anxiety disorder due to known physiological condition: Secondary | ICD-10-CM

## 2024-05-19 MED ORDER — GABAPENTIN 100 MG PO CAPS: 100.0000 mg | ORAL_CAPSULE | Freq: Every day | ORAL | 1 refills | Status: AC | PRN

## 2024-05-19 NOTE — Assessment & Plan Note (Signed)
 Presents with new-onset anxiety and panic attacks, characterized by shortness of breath, palpitations, and chest pain. Symptoms appear to be triggered by multiple life stressors, but mostly new health concerns regarding hyperlipidemia. Denies prior history of anxiety or psychiatric treatment. Examination is reassuring. - Prescribed Gabapentin  100 mg to be used as needed for panic episodes. Counseled on potential for drowsiness and to avoid driving after taking. - Provided with information on daily anxiolytic medications, including SSRIs (Lexapro, Celexa) and Buspar, for consideration if symptoms persist or worsen. - Recommended considering therapy to develop coping strategies. Advised to check with insurance for covered providers. - Discussed lifestyle measures for relaxation, including breathing exercises.

## 2024-05-19 NOTE — Patient Instructions (Addendum)
 It was nice to see you today,  We addressed the following topics today: -I have sent in gabapentin  for you to use as needed.  Take it no more than once a day as needed.  This medication can make you drowsy so do not use it if you about to drive or operate heavy machinery - I have given you some information on the other medications including Celexa and BuSpar which are daily medications - It would be a good idea to talk to a therapist as well regarding your anxiety. -You can go to [facility] imaging at any time when they are open without an appointment to have your chest x-ray performed.  Have a great day,  Rolan Slain, MD

## 2024-05-19 NOTE — Assessment & Plan Note (Signed)
 LDL remains elevated at 159 despite Crestor . ApoB also elevated. Attributed primarily to genetics. - Will hold off on increasing Crestor  dose at this time to allow for a trial of more intensive dietary modification. - Counseled on diet: limit saturated fat to <10g/day (found in red meats, processed foods, dairy). Recommended lean meats, fibrous fruits (berries), vegetables, nuts, and avocados. - Advised on supplements that may help: plant sterols (e.g., Smart Balance), fiber (e.g., Metamucil), and CoQ10. Advised against red yeast rice as it duplicates the statin's mechanism. - Will recheck lipids at next appointment.

## 2024-05-19 NOTE — Progress Notes (Unsigned)
 Acute Office Visit  Subjective:     Patient ID: Brad Wang, male    DOB: [birth year], 43 y.o.   MRN: [id number]  Chief Complaint  Patient presents with   Pain   Anxiety    HPI Patient is in today for ***  Subjective - Reports two episodes of panic attacks in the last two weeks, the first on 05/11/2024 and a less severe one on 05/14/2024. - Symptoms include feeling like the heart is coming out of the chest, shortness of breath, and chest pain, which can be palpated. - The episodes are triggered by worry, particularly about health after starting cholesterol medication. - Attributes the recent onset of anxiety to multiple stressors: a friend being institutionalized, having all three children home for the summer while working from home, and new health concerns after starting cholesterol medication. - Reports increased irritability and being short-tempered. - Notes poor sleep, particularly one night last week, which may have contributed to an episode. - Has been trying to go to bed earlier, which has helped. - Denies any past history of anxiety, depression, or use of medication for these conditions. - Has never seen a therapist.  - Worried about elevated cholesterol and the new Crestor  prescription. - LDL was 159 (down from 180s, goal <100) and ApoB was >130 (goal <100). - Inquired about dietary modifications for cholesterol, including eggs. Has already reduced egg intake and eliminated milk. Tries to avoid red meat but eats chicken and pork. - Reports a 11-pound weight loss since February. - Has reduced exercise in the last week on the advice of a nurse due to chest symptoms.  Medications Current medications include Crestor  for high cholesterol, Zyrtec, and a rarely used albuterol  inhaler for asthma.  PMH, PSH, FH, Social Hx PMHx: High cholesterol, asthma (rarely symptomatic). PSH: None mentioned. FH: Not discussed. 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 Married with  three children, two of whom are under six. Denies smoking. Discussed using THC gummies in the past, which helped with relaxation. Reports prior use of CBD oil for exercise recovery.  ROS Cardiovascular: Reports chest pain and palpitations during panic episodes. Denies crushing chest pain. Respiratory: Reports shortness of breath during panic episodes. Psychiatric: Reports anxiety, panic attacks, irritability, and stress. Reports poor sleep.   ROS      Objective:    BP 118/82   Pulse 65   Ht 6' 1 (1.854 m)   Wt 181 lb 6.4 oz (82.3 kg)   SpO2 97%   BMI 23.93 kg/m  {Vitals History (Optional):23777}  Physical Exam Gen: alert, oriented Heent: perrla, eomi Cv: rrr Pulm: lctab Msk: no deformity of the ribs.  No ttp.    No results found for any visits on 05/19/24.      Assessment & Plan:   Rib pain Assessment & Plan: Reports intermittent, palpable chest wall pain, which worsens anxiety. Physical exam points to a musculoskeletal etiology. - Ordered a chest X-ray for reassurance to rule out significant underlying pathology. Patient advised they can go to [facility] for a walk-in appointment.  Orders: -     DG Chest 2 View; Future  Anxiety disorder due to general medical condition Assessment & Plan: Presents with new-onset anxiety and panic attacks, characterized by shortness of breath, palpitations, and chest pain. Symptoms appear to be triggered by multiple life stressors, but mostly new health concerns regarding hyperlipidemia. Denies prior history of anxiety or psychiatric treatment. Examination is reassuring. - Prescribed Gabapentin  100 mg to  be used as needed for panic episodes. Counseled on potential for drowsiness and to avoid driving after taking. - Provided with information on daily anxiolytic medications, including SSRIs (Lexapro, Celexa) and Buspar, for consideration if symptoms persist or worsen. - Recommended considering therapy to develop coping  strategies. Advised to check with insurance for covered providers. - Discussed lifestyle measures for relaxation, including breathing exercises.    Mixed hyperlipidemia Assessment & Plan: LDL remains elevated at 159 despite Crestor . ApoB also elevated. Attributed primarily to genetics. - Will hold off on increasing Crestor  dose at this time to allow for a trial of more intensive dietary modification. - Counseled on diet: limit saturated fat to <10g/day (found in red meats, processed foods, dairy). Recommended lean meats, fibrous fruits (berries), vegetables, nuts, and avocados. - Advised on supplements that may help: plant sterols (e.g., Smart Balance), fiber (e.g., Metamucil), and CoQ10. Advised against red yeast rice as it duplicates the statin's mechanism. - Will recheck lipids at next appointment.   Other orders -     Gabapentin ; Take 1 capsule (100 mg total) by mouth daily as needed (anxiety).  Dispense: 30 capsule; Refill: 1     Return for aleady has appt.  Toribio MARLA Slain, MD

## 2024-05-19 NOTE — Assessment & Plan Note (Signed)
 Reports intermittent, palpable chest wall pain, which worsens anxiety. Physical exam points to a musculoskeletal etiology. - Ordered a chest X-ray for reassurance to rule out significant underlying pathology. Patient advised they can go to [facility] for a walk-in appointment.

## 2024-10-08 ENCOUNTER — Encounter: Payer: Self-pay | Admitting: Family Medicine

## 2024-10-08 ENCOUNTER — Ambulatory Visit: Payer: Medicaid Other | Admitting: Family Medicine

## 2024-10-08 VITALS — BP 121/74 | HR 69 | Ht 73.0 in | Wt 187.1 lb

## 2024-10-08 DIAGNOSIS — Z0000 Encounter for general adult medical examination without abnormal findings: Secondary | ICD-10-CM | POA: Diagnosis not present

## 2024-10-08 DIAGNOSIS — Z1212 Encounter for screening for malignant neoplasm of rectum: Secondary | ICD-10-CM | POA: Diagnosis not present

## 2024-10-08 DIAGNOSIS — R0789 Other chest pain: Secondary | ICD-10-CM | POA: Diagnosis not present

## 2024-10-08 DIAGNOSIS — E782 Mixed hyperlipidemia: Secondary | ICD-10-CM

## 2024-10-08 DIAGNOSIS — Z1211 Encounter for screening for malignant neoplasm of colon: Secondary | ICD-10-CM

## 2024-10-08 DIAGNOSIS — F064 Anxiety disorder due to known physiological condition: Secondary | ICD-10-CM

## 2024-10-08 NOTE — Patient Instructions (Addendum)
 It was nice to see you today,  We addressed the following topics today: - Please check your Crestor  prescription bottle to confirm if you are taking 5mg  or 10mg .I can then send in the correct prescription.  - your EKG was normal.   - Please proceed to the lab for a blood draw to check your cholesterol levels. - someone will call to schedule your colonoscopy appt  Have a great day,  Rolan Slain, MD

## 2024-10-08 NOTE — Assessment & Plan Note (Signed)
 Reports anxiety is improving and is managed without medication. Anxiety is primarily related to health concerns, specifically hyperlipidemia. - Continue to manage without medication. - Discussed propranolol as a non-sedating option for acute panic if symptoms worsen.

## 2024-10-08 NOTE — Assessment & Plan Note (Signed)
 Due for colonoscopy due to age and family history of colon cancer. - Discussed need for colonoscopy.

## 2024-10-08 NOTE — Assessment & Plan Note (Signed)
 Reports intermittent, non-exertional, stabbing pain in the ribs, sometimes with deep inspiration. EKG performed today for baseline. Pain is likely musculoskeletal or neuropathic, possibly related to a previous rib injury, and exacerbated by anxiety. - EKG performed today, results pending. - Reassured that pain is likely not cardiac in origin.

## 2024-10-08 NOTE — Progress Notes (Signed)
 Annual physical  Subjective   Patient ID: Brad Wang, male    DOB: 26-Dec-1980  Age: 43 y.o. MRN: [id number]  Chief Complaint  Patient presents with   Annual Exam   HPI Brad Wang is a 43 y.o. old male here  for annual exam.   Subjective - Anxiety: Reports anxiety is improving and is managed without medication. Took one dose of a previously prescribed medication but is unsure of its effect. Experiences occasional adrenaline rushes. Anxiety is primarily related to the diagnosis of high cholesterol. - Hyperlipidemia: Taking Crestor , 5mg .  Has made some dietary changes, including reducing red meat, sausage, bacon, and cheese. Has not started a regular exercise routine but plans to join a gym. - Chest Pain: Reports intermittent, random pain in the ribs, sometimes with deep inspiration. Describes it as a stabbing sensation. This symptom is a significant source of anxiety. Denies other cardiac symptoms.  Medications Crestor  5mg  daily, Albuterol  inhaler as needed (rarely used).  PMH, PSH, FH, Social Hx PMH: Hyperlipidemia, anxiety, history of broken ribs. FH: Father had colon cancer. Father was on medication for cholesterol for a short time. Social Hx: Denies smoking. Lives in [facility]  ROS Constitutional: Denies fever, chills. Cardiovascular: Reports intermittent chest pain. Denies palpitations, shortness of breath. Musculoskeletal: Reports pain in ribs. Psychiatric: Reports anxiety.   The 10-year ASCVD risk score (Arnett DK, et al., 2019) is: 2.6%  Health Maintenance Due  Topic Date Due   HIV Screening  Never done   Hepatitis C Screening  Never done   Pneumococcal Vaccine (1 of 2 - PCV) Never done   HPV VACCINES (1 - 3-dose SCDM series) Never done   COVID-19 Vaccine (1 - 2025-26 season) Never done   Colonoscopy  Never done      Objective:     BP 121/74   Pulse 69   Ht 6' 1 (1.854 m)   Wt 187 lb 1.9 oz (84.9 kg)   SpO2 96%   BMI 24.69 kg/m    Physical  Exam Gen: alert, oriented HEENT: perrla, eomi, mmm CV: rrr, no murmur Pulm: lctab. No wheeze or crackles.  GI: soft, nbs.  Nontender to palpation MSK: strength equal b/l. Normal gait Ext: no pedal edema Skin: warm and dry, no rashes Psych: pleasant affect.  Spontaneous speech   No results found for any visits on 10/08/24.      Assessment & Plan:   Physical exam, annual  Encounter for colorectal cancer screening -     Ambulatory referral to Gastroenterology  Chest wall pain -     EKG 12-Lead  Mixed hyperlipidemia Assessment & Plan: History of high cholesterol, currently on Crestor  5mg . Has made some dietary modifications. Plan to recheck lipid panel today. If labs remain elevated, will discuss increasing statin dose. - Recheck lipid panel today. - Continue current dose of Crestor . Will confirm dose at home. - Discussed that medication is for long-term prevention of heart attack and stroke.  Orders: -     Apolipoprotein B -     Lipid panel  Rib pain Assessment & Plan: Reports intermittent, non-exertional, stabbing pain in the ribs, sometimes with deep inspiration. EKG performed today for baseline. Pain is likely musculoskeletal or neuropathic, possibly related to a previous rib injury, and exacerbated by anxiety. - EKG performed today, results pending. - Reassured that pain is likely not cardiac in origin.   Healthcare maintenance Assessment & Plan: Due for colonoscopy due to age and family history of colon cancer. -  Discussed need for colonoscopy.   Anxiety disorder due to general medical condition Assessment & Plan: Reports anxiety is improving and is managed without medication. Anxiety is primarily related to health concerns, specifically hyperlipidemia. - Continue to manage without medication. - Discussed propranolol as a non-sedating option for acute panic if symptoms worsen.      Return in about 1 year (around 10/08/2025) for physical.    Toribio MARLA Slain, MD

## 2024-10-08 NOTE — Assessment & Plan Note (Signed)
 History of high cholesterol, currently on Crestor  5mg . Has made some dietary modifications. Plan to recheck lipid panel today. If labs remain elevated, will discuss increasing statin dose. - Recheck lipid panel today. - Continue current dose of Crestor . Will confirm dose at home. - Discussed that medication is for long-term prevention of heart attack and stroke.

## 2024-10-09 LAB — LIPID PANEL
Chol/HDL Ratio: 5.6 ratio — ABNORMAL HIGH (ref 0.0–5.0)
Cholesterol, Total: 254 mg/dL — ABNORMAL HIGH (ref 100–199)
HDL: 45 mg/dL (ref >39)
LDL Chol Calc (NIH): 177 mg/dL — ABNORMAL HIGH (ref 0–99)
Triglycerides: 172 mg/dL — ABNORMAL HIGH (ref 0–149)
VLDL Cholesterol Cal: 32 mg/dL (ref 5–40)

## 2024-10-09 LAB — APOLIPOPROTEIN B: Apolipoprotein B: 140 mg/dL — ABNORMAL HIGH (ref <90)

## 2024-10-11 ENCOUNTER — Ambulatory Visit: Payer: Self-pay | Admitting: Family Medicine

## 2024-10-11 ENCOUNTER — Other Ambulatory Visit: Payer: Self-pay | Admitting: Family Medicine

## 2024-10-11 MED ORDER — ROSUVASTATIN CALCIUM 10 MG PO TABS: 10.0000 mg | ORAL_TABLET | Freq: Every day | ORAL | 3 refills | Status: AC

## 2025-10-04 ENCOUNTER — Other Ambulatory Visit

## 2025-10-11 ENCOUNTER — Encounter: Admitting: Family Medicine
